# Patient Record
Sex: Male | Born: 1995
Health system: Southern US, Community
[De-identification: ages and names within clinical notes are randomized; demographics above are authoritative.]

## PROBLEM LIST (undated history)

## (undated) DIAGNOSIS — E300 Delayed puberty: Secondary | ICD-10-CM

## (undated) HISTORY — DX: Delayed puberty: E30.0

---
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
## 2016-03-30 ENCOUNTER — Ambulatory Visit: Payer: BLUE CROSS/BLUE SHIELD

## 2016-04-19 ENCOUNTER — Ambulatory Visit (INDEPENDENT_AMBULATORY_CARE_PROVIDER_SITE_OTHER): Payer: BLUE CROSS/BLUE SHIELD | Admitting: Physician Assistant

## 2016-04-19 VITALS — BP 120/74 | HR 93 | Temp 98.2°F | Resp 17 | Ht 70.0 in | Wt 151.0 lb

## 2016-04-19 DIAGNOSIS — J020 Streptococcal pharyngitis: Secondary | ICD-10-CM | POA: Diagnosis not present

## 2016-04-19 DIAGNOSIS — J029 Acute pharyngitis, unspecified: Secondary | ICD-10-CM

## 2016-04-19 LAB — POCT CBC
GRANULOCYTE PERCENT: 76.3 % (ref 37–80)
HCT, POC: 37.6 % — AB (ref 43.5–53.7)
HEMOGLOBIN: 13.4 g/dL — AB (ref 14.1–18.1)
Lymph, poc: 2 (ref 0.6–3.4)
MCH: 31.3 pg — AB (ref 27–31.2)
MCHC: 35.6 g/dL — AB (ref 31.8–35.4)
MCV: 88 fL (ref 80–97)
MID (CBC): 1 — AB (ref 0–0.9)
MPV: 6.9 fL (ref 0–99.8)
PLATELET COUNT, POC: 172 10*3/uL (ref 142–424)
POC Granulocyte: 9.8 — AB (ref 2–6.9)
POC LYMPH PERCENT: 15.7 %L (ref 10–50)
POC MID %: 8 % (ref 0–12)
RBC: 4.27 M/uL — AB (ref 4.69–6.13)
RDW, POC: 13.2 %
WBC: 12.8 10*3/uL — AB (ref 4.6–10.2)

## 2016-04-19 LAB — POCT RAPID STREP A (OFFICE): Rapid Strep A Screen: POSITIVE — AB

## 2016-04-19 MED ORDER — AMOXICILLIN 500 MG PO TABS: 500.0000 mg | ORAL_TABLET | Freq: Two times a day (BID) | ORAL | 0 refills | Status: AC

## 2016-04-19 NOTE — Progress Notes (Signed)
Patrick Davidson  MRN: [id number] DOB: [birth year]  Subjective:  Patrick Davidson is a 20 y.o. male seen in office today for a chief complaint of sore throat x 3 days. Has associated difficulty swallowing, raspy voice, fatigue, and nasal congestion. Denies fever, cough, body aches, chills, and ear fullness  Of note, [facility] for sore throat and was tested for both the strep and flu.. They were both negative but he was treated empirically with 10 day course of cefnidir for strep throat. No culture was ordered. Notes his sore throat completley resolved after being on cefdiner for about 2-3 days.  Pt has history of sore throats but has never tested positive for strep. His last mono test in 07/2015 was positive for IgG.  Pt's mother, grandmother, and older have history of Zenkers diverticulum and have had surgery to repair it due to recurring throat infections. His mother has him scheduled to see her ENT on 05/03/16.   Review of Systems  Respiratory: Negative for shortness of breath.   Cardiovascular: Negative for chest pain and palpitations.  Gastrointestinal: Negative for diarrhea, nausea and vomiting.  Skin: Negative for rash.   There are no active problems to display for this patient.  No current outpatient prescriptions on file prior to visit.   No current facility-administered medications on file prior to visit.     No Known Allergies   Objective:  BP 120/74 (BP Location: Right Arm, Patient Position: Sitting, Cuff Size: Normal)   Pulse 93   Temp 98.2 F (36.8 C) (Oral)   Resp 17   Ht 5\' 10"  (1.778 m)   Wt 151 lb (68.5 kg)   SpO2 99%   BMI 21.67 kg/m   Physical Exam  Constitutional: He is oriented to person, place, and time and well-developed, well-nourished, and in no distress.  HENT:  Head: Normocephalic and atraumatic.  Mouth/Throat: Uvula is midline and mucous membranes are normal. Posterior oropharyngeal erythema present.    Tonsils are exudative and swollen bilaterally.   Eyes: Conjunctivae are normal.  Neck: Normal range of motion.  Cardiovascular: Normal rate, regular rhythm and normal heart sounds.   Pulmonary/Chest: Effort normal and breath sounds normal.  Lymphadenopathy:    He has cervical adenopathy.       Right cervical: Posterior cervical adenopathy present. No superficial cervical and no deep cervical adenopathy present.      Left cervical: Posterior cervical adenopathy present. No superficial cervical and no deep cervical adenopathy present.       Right: No supraclavicular adenopathy present.       Left: No supraclavicular adenopathy present.  Neurological: He is alert and oriented to person, place, and time. Gait normal.  Skin: Skin is warm and dry.  Psychiatric: Affect normal.   Results for orders placed or performed in visit on 04/19/16 (from the past 24 hour(s))  POCT rapid strep A     Status: Abnormal   Collection Time: 04/19/16  4:38 PM  Result Value Ref Range   Rapid Strep A Screen Positive (A) Negative  POCT CBC     Status: Abnormal   Collection Time: 04/19/16  4:45 PM  Result Value Ref Range   WBC 12.8 (A) 4.6 - 10.2 K/uL   Lymph, poc 2.0 0.6 - 3.4   POC LYMPH PERCENT 15.7 10 - 50 %L   MID (cbc) 1.0 (A) 0 - 0.9   POC MID % 8.0 0 - 12 %M   POC  Granulocyte 9.8 (A) 2 - 6.9   Granulocyte percent 76.3 37 - 80 %G   RBC 4.27 (A) 4.69 - 6.13 M/uL   Hemoglobin 13.4 (A) 14.1 - 18.1 g/dL   HCT, POC 16.137.6 (A) 09.643.5 - 53.7 %   MCV 88.0 80 - 97 fL   MCH, POC 31.3 (A) 27 - 31.2 pg   MCHC 35.6 (A) 31.8 - 35.4 g/dL   RDW, POC 04.513.2 %   Platelet Count, POC 172 142 - 424 K/uL   MPV 6.9 0 - 99.8 fL    Assessment and Plan :  1. Strep pharyngitis -Although pt was recently just treated for strep with cefdinir, since patient has never actually tested positive for strep, will treat with first line amoxicillin. However, pt instructed that if he develops a fever or worsening symptoms over the next  24-48 hours to return to clinic as he may need to be treated for recurrent strep with clindamycin at this time.   - POCT CBC - POCT rapid strep A - Epstein-Barr virus VCA antibody panel - Culture, Group A Strep - amoxicillin (AMOXIL) 500 MG tablet; Take 1 tablet (500 mg total) by mouth 2 (two) times daily.  Dispense: 20 tablet; Refill: 0   Patrick CoreBrittany Jeromey Kruer PA-C  [facility] 04/19/2016 4:52 PM

## 2016-04-19 NOTE — Patient Instructions (Addendum)
Return to clinic if you develop fever or start to feel worse. We are closed Thursday so go to the ER if symptoms are worse on that day.    Strep Throat Strep throat is an infection of the throat. It is caused by germs. Strep throat spreads from person to person because of coughing, sneezing, or close contact. Follow these instructions at home: Medicines  Take over-the-counter and prescription medicines only as told by your doctor.  Take your antibiotic medicine as told by your doctor. Do not stop taking the medicine even if you feel better.  Have family members who also have a sore throat or fever go to a doctor. Eating and drinking  Do not share food, drinking cups, or personal items.  Try eating soft foods until your sore throat feels better.  Drink enough fluid to keep your pee (urine) clear or pale yellow. General instructions  Rinse your mouth (gargle) with a salt-water mixture 3-4 times per day or as needed. To make a salt-water mixture, stir -1 tsp of salt into 1 cup of warm water.  Make sure that all people in your house wash their hands well.  Rest.  Stay home from school or work until you have been taking antibiotics for 24 hours.  Keep all follow-up visits as told by your doctor. This is important. Contact a doctor if:  Your neck keeps getting bigger.  You get a rash, cough, or earache.  You cough up thick liquid that is green, yellow-brown, or bloody.  You have pain that does not get better with medicine.  Your problems get worse instead of getting better.  You have a fever. Get help right away if:  You throw up (vomit).  You get a very bad headache.  You neck hurts or it feels stiff.  You have chest pain or you are short of breath.  You have drooling, very bad throat pain, or changes in your voice.  Your neck is swollen or the skin gets red and tender.  Your mouth is dry or you are peeing less than normal.  You keep feeling more tired or it is  hard to wake up.  Your joints are red or they hurt. This information is not intended to replace advice given to you by your health care provider. Make sure you discuss any questions you have with your health care provider. Document Released: 11/02/2007 Document Revised: 01/13/2016 Document Reviewed: 09/08/2014 Elsevier Interactive Patient Education  2017 [employer]     IF you received an x-ray today, you will receive an invoice from [facility]. Please contact [facility] at [phone] with questions or concerns regarding your invoice.   IF you received labwork today, you will receive an invoice from [facility]. Please contact [facility] at [phone] with questions or concerns regarding your invoice.   Our billing staff will not be able to assist you with questions regarding bills from these companies.  You will be contacted with the lab results as soon as they are available. The fastest way to get your results is to activate your My Chart account. Instructions are located on the last page of this paperwork. If you have not heard from us regarding the results in 2 weeks, please contact this office.

## 2016-04-20 LAB — EPSTEIN-BARR VIRUS VCA ANTIBODY PANEL
EBV NA IgG: 127 U/mL — ABNORMAL HIGH
EBV VCA IgG: 98.1 U/mL — ABNORMAL HIGH
EBV VCA IgM: <36 U/mL

## 2016-05-05 DIAGNOSIS — J0391 Acute recurrent tonsillitis, unspecified: Secondary | ICD-10-CM | POA: Diagnosis not present

## 2016-06-27 ENCOUNTER — Ambulatory Visit (INDEPENDENT_AMBULATORY_CARE_PROVIDER_SITE_OTHER): Payer: BLUE CROSS/BLUE SHIELD | Admitting: Emergency Medicine

## 2016-06-27 VITALS — BP 120/80 | HR 81 | Temp 98.1°F | Resp 18 | Ht 70.02 in | Wt 156.4 lb

## 2016-06-27 DIAGNOSIS — R05 Cough: Secondary | ICD-10-CM | POA: Diagnosis not present

## 2016-06-27 DIAGNOSIS — J069 Acute upper respiratory infection, unspecified: Secondary | ICD-10-CM

## 2016-06-27 DIAGNOSIS — R059 Cough, unspecified: Secondary | ICD-10-CM

## 2016-06-27 MED ORDER — DM-GUAIFENESIN ER 30-600 MG PO TB12: 1.0000 | ORAL_TABLET | Freq: Two times a day (BID) | ORAL | 0 refills | Status: AC

## 2016-06-27 MED ORDER — BENZONATATE 200 MG PO CAPS: 200.0000 mg | ORAL_CAPSULE | Freq: Two times a day (BID) | ORAL | 0 refills | Status: DC | PRN

## 2016-06-27 NOTE — Progress Notes (Signed)
Patrick Davidson 21 y.o.   Chief Complaint  Patient presents with  . Sore Throat  . Fatigue  . Cough    HISTORY OF PRESENT ILLNESS: This is a 21 y.o. male complaining of sore throat, fatigue, and cough since last week. Feeling better today but cough still lingering.  Sore Throat   This is a new problem. The current episode started in the past 7 days. The problem has been rapidly improving. There has been no fever. Associated symptoms include congestion, coughing and headaches. Pertinent negatives include no abdominal pain, diarrhea, ear pain, neck pain, shortness of breath or vomiting. 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  Cough  This is a new problem. The current episode started in the past 7 days. The problem has been gradually improving. The problem occurs hourly. The cough is non-productive. Associated symptoms include chills, headaches and nasal congestion. Pertinent negatives include no chest pain, ear congestion, ear pain, eye redness, fever, hemoptysis, myalgias, rash, sore throat, shortness of breath or wheezing. Risk factors: none. chronic throat infections     Prior to Admission medications   Medication Sig Start Date End Date Taking? Authorizing Provider  benzonatate (TESSALON) 200 MG capsule Take 1 capsule (200 mg total) by mouth 2 (two) times daily as needed for cough. 06/27/16   Georgina QuintMiguel Jose Devona Holmes, MD  dextromethorphan-guaiFENesin [facility](MUCINEX DM) 30-600 MG 12hr tablet Take 1 tablet by mouth 2 (two) times daily. 06/27/16 07/02/16  Georgina QuintMiguel Jose Trasean Delima, MD    No Known Allergies  There are no active problems to display for this patient.   Past Medical History:  Diagnosis Date  . Pubertal delay     History reviewed. No pertinent surgical history.  Social History   Social History  . Marital status: Single    Spouse name: N/A  . Number of children: N/A  . Years of education: N/A   Occupational History  . Not on file.   Social History Main Topics  . Smoking status:  Never Smoker  . Smokeless tobacco: Never Used  . Alcohol use No  . Drug use: No  . Sexual activity: No   Other Topics Concern  . Not on file   Social History Narrative   Lives with mom and dad and brother. 8th grade. Doing well in school. Soccer travel team    Family History  Problem Relation Age of Onset  . Cancer Paternal Grandfather     lung  . Delayed puberty Mother   . Hypertension Mother   . Delayed puberty Father   . Early puberty Brother   . Cancer Maternal Grandmother     skin  . Hypertension Maternal Grandfather   . Heart attack Maternal Grandfather   . Thyroid disease Paternal Grandmother     graves dx age 20     Review of Systems  Constitutional: Positive for chills. Negative for fever.  HENT: Positive for congestion. Negative for ear pain, nosebleeds, sinus pain and sore throat.   Eyes: Negative for discharge and redness.  Respiratory: Positive for cough. Negative for hemoptysis, sputum production, shortness of breath and wheezing.   Cardiovascular: Negative for chest pain and palpitations.  Gastrointestinal: Negative for abdominal pain, diarrhea, nausea and vomiting.  Genitourinary: Negative for dysuria and hematuria.  Musculoskeletal: Negative for myalgias and neck pain.  Skin: Negative for rash.  Neurological: Positive for headaches. Negative for dizziness and sensory change.  Endo/Heme/Allergies: Negative.   Psychiatric/Behavioral: Negative.   All other systems reviewed and are negative.   Vitals:  06/27/16 1106  BP: 120/80  Pulse: 81  Resp: 18  Temp: 98.1 F (36.7 C)    Physical Exam  Constitutional: He is oriented to person, place, and time. He appears well-developed and well-nourished.  HENT:  Head: Normocephalic and atraumatic.  Right Ear: External ear normal.  Left Ear: External ear normal.  Nose: Nose normal.  Mild erythema oropharynx, no exudates  Eyes: Conjunctivae and EOM are normal. Pupils are equal, round, and reactive to  light.  Neck: Normal range of motion. Neck supple.  Cardiovascular: Normal rate, regular rhythm and normal heart sounds.   Pulmonary/Chest: Effort normal and breath sounds normal. He has no wheezes. He has no rales.  Abdominal: Soft. He exhibits no distension. There is no tenderness.  Musculoskeletal: Normal range of motion. He exhibits no edema or tenderness.  Neurological: He is alert and oriented to person, place, and time. No sensory deficit. He exhibits normal muscle tone.  Skin: Skin is warm and dry. Capillary refill takes less than 2 seconds.  Psychiatric: He has a normal mood and affect. His behavior is normal.  Vitals reviewed.    ASSESSMENT & PLAN: Patrick Davidson was seen today for sore throat, fatigue and cough.  Diagnoses and all orders for this visit:  Acute upper respiratory infection  Cough  Other orders -     benzonatate (TESSALON) 200 MG capsule; Take 1 capsule (200 mg total) by mouth 2 (two) times daily as needed for cough. -     dextromethorphan-guaiFENesin (MUCINEX DM) 30-600 MG 12hr tablet; Take 1 tablet by mouth 2 (two) times daily.    Patient Instructions       IF you received an x-ray today, you will receive an invoice from [facility]. Please contact [facility] at [phone] with questions or concerns regarding your invoice.   IF you received labwork today, you will receive an invoice from [facility]. Please contact [facility] at [phone] with questions or concerns regarding your invoice.   Our billing staff will not be able to assist you with questions regarding bills from these companies.  You will be contacted with the lab results as soon as they are available. The fastest way to get your results is to activate your My Chart account. Instructions are located on the last page of this paperwork. If you have not heard from [place] regarding the results in 2 weeks, please contact this office.     Upper Respiratory Infection, Adult Most  upper respiratory infections (URIs) are caused by a virus. A URI affects the nose, throat, and upper air passages. The most common type of URI is often called "the common cold." Follow these instructions at home:  Take medicines only as told by your doctor.  Gargle warm saltwater or take cough drops to comfort your throat as told by your doctor.  Use a warm mist humidifier or inhale steam from a shower to increase air moisture. This may make it easier to breathe.  Drink enough fluid to keep your pee (urine) clear or pale yellow.  Eat soups and other clear broths.  Have a healthy diet.  Rest as needed.  Go back to work when your fever is gone or your doctor says it is okay.  You may need to stay home longer to avoid giving your URI to others.  You can also wear a face mask and wash your hands often to prevent spread of the virus.  Use your inhaler more if you have asthma.  Do not use any tobacco  products, including cigarettes, chewing tobacco, or electronic cigarettes. If you need help quitting, ask your doctor. Contact a doctor if:  You are getting worse, not better.  Your symptoms are not helped by medicine.  You have chills.  You are getting more short of breath.  You have brown or red mucus.  You have yellow or brown discharge from your nose.  You have pain in your face, especially when you bend forward.  You have a fever.  You have puffy (swollen) neck glands.  You have pain while swallowing.  You have white areas in the back of your throat. Get help right away if:  You have very bad or constant:  Headache.  Ear pain.  Pain in your forehead, behind your eyes, and over your cheekbones (sinus pain).  Chest pain.  You have long-lasting (chronic) lung disease and any of the following:  Wheezing.  Long-lasting cough.  Coughing up blood.  A change in your usual mucus.  You have a stiff neck.  You have changes in  your:  Vision.  Hearing.  Thinking.  Mood. This information is not intended to replace advice given to you by your health care provider. Make sure you discuss any questions you have with your health care provider. Document Released: 11/02/2007 Document Revised: 01/17/2016 Document Reviewed: 08/21/2013 Elsevier Interactive Patient Education  2017 Elsevier Inc.      Edwina Barth, MD [facility]

## 2016-06-27 NOTE — Patient Instructions (Addendum)
     IF you received an x-ray today, you will receive an invoice from [facility]. Please contact [facility] at [phone] with questions or concerns regarding your invoice.   IF you received labwork today, you will receive an invoice from [facility]. Please contact [facility] at [phone] with questions or concerns regarding your invoice.   Our billing staff will not be able to assist you with questions regarding bills from these companies.  You will be contacted with the lab results as soon as they are available. The fastest way to get your results is to activate your My Chart account. Instructions are located on the last page of this paperwork. If you have not heard from us regarding the results in 2 weeks, please contact this office.      Upper Respiratory Infection, Adult Most upper respiratory infections (URIs) are caused by a virus. A URI affects the nose, throat, and upper air passages. The most common type of URI is often called "the common cold." Follow these instructions at home:  Take medicines only as told by your doctor.  Gargle warm saltwater or take cough drops to comfort your throat as told by your doctor.  Use a warm mist humidifier or inhale steam from a shower to increase air moisture. This may make it easier to breathe.  Drink enough fluid to keep your pee (urine) clear or pale yellow.  Eat soups and other clear broths.  Have a healthy diet.  Rest as needed.  Go back to work when your fever is gone or your doctor says it is okay.  You may need to stay home longer to avoid giving your URI to others.  You can also wear a face mask and wash your hands often to prevent spread of the virus.  Use your inhaler more if you have asthma.  Do not use any tobacco products, including cigarettes, chewing tobacco, or electronic cigarettes. If you need help quitting, ask your doctor. Contact a doctor if:  You are getting worse, not better.  Your  symptoms are not helped by medicine.  You have chills.  You are getting more short of breath.  You have brown or red mucus.  You have yellow or brown discharge from your nose.  You have pain in your face, especially when you bend forward.  You have a fever.  You have puffy (swollen) neck glands.  You have pain while swallowing.  You have white areas in the back of your throat. Get help right away if:  You have very bad or constant:  Headache.  Ear pain.  Pain in your forehead, behind your eyes, and over your cheekbones (sinus pain).  Chest pain.  You have long-lasting (chronic) lung disease and any of the following:  Wheezing.  Long-lasting cough.  Coughing up blood.  A change in your usual mucus.  You have a stiff neck.  You have changes in your:  Vision.  Hearing.  Thinking.  Mood. This information is not intended to replace advice given to you by your health care provider. Make sure you discuss any questions you have with your health care provider. Document Released: 11/02/2007 Document Revised: 01/17/2016 Document Reviewed: 08/21/2013 Elsevier Interactive Patient Education  2017 Elsevier Inc.  

## 2016-07-05 DIAGNOSIS — D225 Melanocytic nevi of trunk: Secondary | ICD-10-CM | POA: Diagnosis not present

## 2016-07-05 DIAGNOSIS — D229 Melanocytic nevi, unspecified: Secondary | ICD-10-CM | POA: Diagnosis not present

## 2016-07-05 DIAGNOSIS — D485 Neoplasm of uncertain behavior of skin: Secondary | ICD-10-CM | POA: Diagnosis not present

## 2016-12-30 ENCOUNTER — Encounter (HOSPITAL_COMMUNITY): Payer: Self-pay | Admitting: Emergency Medicine

## 2016-12-30 DIAGNOSIS — Z202 Contact with and (suspected) exposure to infections with a predominantly sexual mode of transmission: Secondary | ICD-10-CM | POA: Diagnosis not present

## 2016-12-30 NOTE — ED Triage Notes (Signed)
Pt states he wants to be checked for STDs  Pt denies any sxs

## 2016-12-31 ENCOUNTER — Emergency Department (HOSPITAL_COMMUNITY)
Admission: EM | Admit: 2016-12-31 | Discharge: 2016-12-31 | Disposition: A | Discharge status: Home / Self Care | Payer: BLUE CROSS/BLUE SHIELD | Attending: Emergency Medicine | Admitting: Emergency Medicine

## 2016-12-31 DIAGNOSIS — Z202 Contact with and (suspected) exposure to infections with a predominantly sexual mode of transmission: Secondary | ICD-10-CM

## 2016-12-31 LAB — RPR: RPR: NONREACTIVE

## 2016-12-31 LAB — HIV ANTIBODY (ROUTINE TESTING W REFLEX): HIV SCREEN 4TH GENERATION: NONREACTIVE

## 2016-12-31 NOTE — Discharge Instructions (Signed)
Please read attached information. If you experience any new or worsening signs or symptoms please return to the emergency room for evaluation. [facility] testing or treatment.

## 2016-12-31 NOTE — ED Provider Notes (Signed)
[facility]-EMERGENCY DEPT Provider Note   CSN: [id number] Arrival date & time: 12/30/16  2343     History   Chief Complaint Chief Complaint  Patient presents with  . STD check    HPI Patrick Davidson is a 21 y.o. male.  HPI    21 year-old male presents today for STD check. Patient reports that He was contacted by one of his friends reported that a neutral sexual partner had an STD. Patient denies any penile discharge, rash, pain, dysuria, or any other significant signs or symptoms. Patient denies any history of STD in the past.    Past Medical History:  Diagnosis Date  . Pubertal delay     Patient Active Problem List   Diagnosis Date Noted  . Acute upper respiratory infection 06/27/2016    History reviewed. No pertinent surgical history.     Home Medications    Prior to Admission medications   Medication Sig Start Date End Date Taking? Authorizing Provider  benzonatate (TESSALON) 200 MG capsule Take 1 capsule (200 mg total) by mouth 2 (two) times daily as needed for cough. 06/27/16   Georgina QuintSagardia, Miguel Jose, MD    Family History Family History  Problem Relation Age of Onset  . Cancer Paternal Grandfather        lung  . Delayed puberty Mother   . Hypertension Mother   . Delayed puberty Father   . Early puberty Brother   . Cancer Maternal Grandmother        skin  . Hypertension Maternal Grandfather   . Heart attack Maternal Grandfather   . Thyroid disease Paternal Grandmother        graves dx age 270    Social History Social History  Substance Use Topics  . Smoking status: Never Smoker  . Smokeless tobacco: Never Used  . Alcohol use Yes     Comment: social     Allergies   Patient has no known allergies.   Review of Systems Review of Systems  All other systems reviewed and are negative.    Physical Exam Updated Vital Signs BP (!) 145/72 (BP Location: Left Arm)   Pulse (!) 56   Temp 98.4 F (36.9 C) (Oral)   Resp 16   Ht 5\' 10"  (1.778 m)    Wt 70.3 kg (155 lb)   SpO2 100%   BMI 22.24 kg/m   Physical Exam  Constitutional: He is oriented to person, place, and time. He appears well-developed and well-nourished.  HENT:  Head: Normocephalic and atraumatic.  Eyes: Pupils are equal, round, and reactive to light. Conjunctivae are normal. Right eye exhibits no discharge. Left eye exhibits no discharge. No scleral icterus.  Neck: Normal range of motion. No JVD present. No tracheal deviation present.  Pulmonary/Chest: Effort normal. No stridor.  Genitourinary:  Genitourinary Comments: Circumcised no rashes  Neurological: He is alert and oriented to person, place, and time. Coordination normal.  Psychiatric: He has a normal mood and affect. His behavior is normal. Judgment and thought content normal.  Nursing note and vitals reviewed.    ED Treatments / Results  Labs (all labs ordered are listed, but only abnormal results are displayed) Labs Reviewed  RPR  HIV ANTIBODY (ROUTINE TESTING)  GC/CHLAMYDIA PROBE AMP ([place]) NOT AT [facility]    EKG  EKG Interpretation None       Radiology No results found.  Procedures Procedures (including critical care time)  Medications Ordered in ED Medications - No data to display  Initial Impression / Assessment and Plan / ED Course  I have reviewed the triage vital signs and the nursing notes.  Pertinent labs & imaging results that were available during my care of the patient were reviewed by me and considered in my medical decision making (see chart for details).      Final Clinical Impressions(s) / ED Diagnoses   Final diagnoses:  Possible exposure to STD    Labs: RPR, HIV  Imaging:  Consults:  Therapeutics:  Discharge Meds:    Assessment/Plan:    New Prescriptions New Prescriptions   No medications on file     Eyvonne MechanicHedges, Arnitra Sokoloski, PA-C 12/31/16 0108    Ward, Layla MawKristen N, DO 12/31/16 [id number]

## 2017-01-02 LAB — GC/CHLAMYDIA PROBE AMP (~~LOC~~) NOT AT ARMC
CHLAMYDIA, DNA PROBE: NEGATIVE
Neisseria Gonorrhea: NEGATIVE

## 2017-06-19 DIAGNOSIS — Z202 Contact with and (suspected) exposure to infections with a predominantly sexual mode of transmission: Secondary | ICD-10-CM | POA: Diagnosis not present

## 2018-05-28 DIAGNOSIS — D2239 Melanocytic nevi of other parts of face: Secondary | ICD-10-CM | POA: Diagnosis not present

## 2018-05-28 DIAGNOSIS — D2262 Melanocytic nevi of left upper limb, including shoulder: Secondary | ICD-10-CM | POA: Diagnosis not present

## 2018-05-28 DIAGNOSIS — D225 Melanocytic nevi of trunk: Secondary | ICD-10-CM | POA: Diagnosis not present

## 2018-06-15 DIAGNOSIS — D2262 Melanocytic nevi of left upper limb, including shoulder: Secondary | ICD-10-CM | POA: Diagnosis not present

## 2018-07-03 ENCOUNTER — Ambulatory Visit: Payer: BLUE CROSS/BLUE SHIELD | Admitting: Physician Assistant

## 2018-07-26 DIAGNOSIS — S62514A Nondisplaced fracture of proximal phalanx of right thumb, initial encounter for closed fracture: Secondary | ICD-10-CM | POA: Diagnosis not present

## 2018-07-26 DIAGNOSIS — M79641 Pain in right hand: Secondary | ICD-10-CM | POA: Diagnosis not present

## 2018-07-26 DIAGNOSIS — M79644 Pain in right finger(s): Secondary | ICD-10-CM | POA: Diagnosis not present

## 2018-08-21 DIAGNOSIS — D485 Neoplasm of uncertain behavior of skin: Secondary | ICD-10-CM | POA: Diagnosis not present

## 2018-08-21 DIAGNOSIS — L905 Scar conditions and fibrosis of skin: Secondary | ICD-10-CM | POA: Diagnosis not present

## 2018-08-21 DIAGNOSIS — D2239 Melanocytic nevi of other parts of face: Secondary | ICD-10-CM | POA: Diagnosis not present

## 2018-11-23 DIAGNOSIS — L905 Scar conditions and fibrosis of skin: Secondary | ICD-10-CM | POA: Diagnosis not present

## 2019-03-01 ENCOUNTER — Other Ambulatory Visit: Payer: Self-pay

## 2019-03-01 ENCOUNTER — Ambulatory Visit
Admission: EM | Admit: 2019-03-01 | Discharge: 2019-03-01 | Disposition: A | Discharge status: Home / Self Care | Payer: BC Managed Care – PPO | Attending: Physician Assistant | Admitting: Physician Assistant

## 2019-03-01 DIAGNOSIS — R0981 Nasal congestion: Secondary | ICD-10-CM

## 2019-03-01 DIAGNOSIS — R0789 Other chest pain: Secondary | ICD-10-CM

## 2019-03-01 NOTE — ED Triage Notes (Signed)
Pt presents to UC stating he has had tightness in his chest starting 4 days ago. Pt's mother states 3 weeks ago he had severe nasal congestion.

## 2019-03-01 NOTE — ED Provider Notes (Signed)
[facility]    CSN: [id number] Arrival date & time: 03/01/19  1114      History   Chief Complaint No chief complaint on file.   HPI Patrick Davidson is a 23 y.o. male.   23 year old male comes in with mother for 4 day history of chest tightness. States this occurs while he is running. He is on the track team, and has been training for a marathon. States feels like he needs to take deeper breaths. He is still able to finish full course without difficulty and without taking more breaks. States 3 weeks ago, had severe nasal congestion, which is normal for patient. At this time, nasal congestion has resolved. Denies other URI symptoms such as cough, rhinorrhea, sore throat. Denies fever, chills, body aches. Denies abdominal pain, nausea, vomiting, diarrhea. Denies shortness of breath, loss of taste/smell. Denies chest pain, orthopnea, leg swelling. Denies current symptoms. Has had teammates tested for COVID, unknown results.  Mother worries of heart problems. Patient denies dizziness/lightheadedness/syncope during exertion. Denies exertional chest pain. Denies personal history of heart disease. Mother states she started HTN medication at age 27, but denies heart disease. Grandfather with MI at age 42.      Past Medical History:  Diagnosis Date  . Pubertal delay     Patient Active Problem List   Diagnosis Date Noted  . Acute upper respiratory infection 06/27/2016    History reviewed. No pertinent surgical history.     Home Medications    Prior to Admission medications   Not on File    Family History Family History  Problem Relation Age of Onset  . Cancer Paternal Grandfather        lung  . Delayed puberty Mother   . Hypertension Mother   . Delayed puberty Father   . Early puberty Brother   . Cancer Maternal Grandmother        skin  . Hypertension Maternal Grandfather   . Heart attack Maternal Grandfather   . Thyroid disease Paternal Grandmother      graves dx age 69    Social History Social History   Tobacco Use  . Smoking status: Never Smoker  . Smokeless tobacco: Never Used  Substance Use Topics  . Alcohol use: Yes    Comment: weekly  . Drug use: No     Allergies   Patient has no known allergies.   Review of Systems Review of Systems  Reason unable to perform ROS: See HPI as above.     Physical Exam Triage Vital Signs ED Triage Vitals  Enc Vitals Group     BP 03/01/19 1124 128/75     Pulse Rate 03/01/19 1124 71     Resp 03/01/19 1124 16     Temp 03/01/19 1124 97.8 F (36.6 C)     Temp Source 03/01/19 1124 Oral     SpO2 03/01/19 1124 98 %     Weight --      Height --      Head Circumference --      Peak Flow --      Pain Score 03/01/19 1128 0     Pain Loc --      Pain Patrick? --      Excl. in GC? --    No data found.  Updated Vital Signs BP 128/75 (BP Location: Left Arm)   Pulse 71   Temp 97.8 F (36.6 C) (Oral)   Resp 16   SpO2 98%  Physical Exam Constitutional:      General: He is not in acute distress.    Appearance: Normal appearance. He is not ill-appearing, toxic-appearing or diaphoretic.  HENT:     Head: Normocephalic and atraumatic.     Mouth/Throat:     Mouth: Mucous membranes are moist.     Pharynx: Oropharynx is clear. Uvula midline.  Neck:     Musculoskeletal: Normal range of motion and neck supple.  Cardiovascular:     Rate and Rhythm: Normal rate and regular rhythm.     Heart sounds: Normal heart sounds. No murmur. No friction rub. No gallop.   Pulmonary:     Effort: Pulmonary effort is normal. No accessory muscle usage, prolonged expiration, respiratory distress or retractions.     Comments: Lungs clear to auscultation without adventitious lung sounds. Skin:    General: Skin is warm and dry.  Neurological:     General: No focal deficit present.     Mental Status: He is alert and oriented to person, place, and time.      UC Treatments / Results  Labs (all labs  ordered are listed, but only abnormal results are displayed) Labs Reviewed - No data to display  EKG   Radiology No results found.  Procedures Procedures (including critical care time)  Medications Ordered in UC Medications - No data to display  Initial Impression / Assessment and Plan / UC Course  I have reviewed the triage vital signs and the nursing notes.  Pertinent labs & imaging results that were available during my care of the patient were reviewed by me and considered in my medical decision making (see chart for details).    Patient currently asymptomatic.  EKG sinus bradycardia, 55 bpm, rightward axis, early repolarization, no acute ST changes.  No prior EKGs for comparison.  Discussed COVID testing, patient would like to be tested at school due to a faster turnaround.  Otherwise, monitor symptoms, patient to follow-up with PCP for further evaluation and management if continues with symptoms.  Return precautions given.  Patient expresses understanding and agrees to plan.  Final Clinical Impressions(s) / UC Diagnoses   Final diagnoses:  Nasal congestion  Chest tightness    ED Prescriptions    None     PDMP not reviewed this encounter.   Ok Edwards, PA-C 03/01/19 1418

## 2019-03-01 NOTE — Discharge Instructions (Addendum)
EKG with slow heart rate-- this is normally due to good fitness at your age-- no alarming rhythm. No alarming signs on exam. As discussed, will have you do COVID testing at school for faster results. Remain quarantined until testing results return. Can use flonase/nasacort over the counter for nasal congestion. Continue to monitor your symptoms. If continues with chest tightness while running, please follow up with PCP for further evaluation and management needed.

## 2019-04-22 DIAGNOSIS — Z1159 Encounter for screening for other viral diseases: Secondary | ICD-10-CM | POA: Diagnosis not present

## 2019-07-31 ENCOUNTER — Encounter: Payer: Self-pay | Admitting: Emergency Medicine

## 2019-07-31 ENCOUNTER — Ambulatory Visit
Admission: EM | Admit: 2019-07-31 | Discharge: 2019-07-31 | Disposition: A | Discharge status: Home / Self Care | Payer: PRIVATE HEALTH INSURANCE

## 2019-07-31 DIAGNOSIS — K591 Functional diarrhea: Secondary | ICD-10-CM

## 2019-07-31 NOTE — ED Provider Notes (Signed)
[facility]    CSN: [id number] Arrival date & time: 07/31/19  1527      History   Chief Complaint Chief Complaint  Patient presents with  . Fecal Impaction    HPI Esker D Cottingham is a 24 y.o. male with history of bradycardia presenting for evaluation of loose stools.  States this has been ongoing since January.  Patient denying watery, fatty, bloody stools, increased frequency of bowel movements, rectal or anal pain or spasming.  Denies anal leakage, change in urinary symptoms, abdominal pain, nausea, vomiting.  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  Denies fever, chills, arthralgias or myalgias.  States his mother does have history of IBS, though patient does not.  Patient states he used to eat food on campus at a different location, though this past semester (since January) he has been eating at a different site.    Past Medical History:  Diagnosis Date  . Pubertal delay     Patient Active Problem List   Diagnosis Date Noted  . Acute upper respiratory infection 06/27/2016    History reviewed. No pertinent surgical history.     Home Medications    Prior to Admission medications   Not on File    Family History Family History  Problem Relation Age of Onset  . Cancer Paternal Grandfather        lung  . Delayed puberty Mother   . Hypertension Mother   . Delayed puberty Father   . Early puberty Brother   . Cancer Maternal Grandmother        skin  . Hypertension Maternal Grandfather   . Heart attack Maternal Grandfather   . Thyroid disease Paternal Grandmother        graves dx age 46    Social History Social History   Tobacco Use  . Smoking status: Never Smoker  . Smokeless tobacco: Never Used  Substance Use Topics  . Alcohol use: Yes    Comment: weekly  . Drug use: No     Allergies   Patient has no known  allergies.   Review of Systems As per HPI   Physical Exam Triage Vital Signs ED Triage Vitals  Enc Vitals Group     BP      Pulse      Resp      Temp      Temp src      SpO2      Weight      Height      Head Circumference      Peak Flow      Pain Score      Pain Loc      Pain Edu?      Excl. in GC?    No data found.  Updated Vital Signs BP 123/62 (BP Location: Left Arm)   Pulse (!) 52   Temp 98 F (36.7 C) (Oral)   Resp 16   SpO2 98%   Visual Acuity Right Eye Distance:   Left Eye Distance:   Bilateral Distance:    Right Eye Near:   Left Eye Near:    Bilateral Near:     Physical Exam Constitutional:      General: He is not in acute distress.    Appearance: He is normal weight. He is not ill-appearing or diaphoretic.  HENT:     Head: Normocephalic and  atraumatic.     Mouth/Throat:     Mouth: Mucous membranes are moist.     Pharynx: Oropharynx is clear.     Comments: No oral lesions or ulcerations Eyes:     General: No scleral icterus.    Conjunctiva/sclera: Conjunctivae normal.     Pupils: Pupils are equal, round, and reactive to light.  Cardiovascular:     Rate and Rhythm: Regular rhythm. Bradycardia present.     Heart sounds: No murmur. No gallop.   Pulmonary:     Effort: Pulmonary effort is normal. No respiratory distress.     Breath sounds: No wheezing or rales.  Abdominal:     General: Abdomen is flat. Bowel sounds are normal. There is no distension.     Palpations: Abdomen is soft. There is no mass.     Tenderness: There is no abdominal tenderness. There is no right CVA tenderness, left CVA tenderness, guarding or rebound.     Hernia: No hernia is present.     Comments: Negative Murphy's, McBurney's, Rovsing signs.  No hepatosplenomegaly.  Musculoskeletal:        General: Normal range of motion.     Cervical back: Neck supple. No tenderness.     Right lower leg: No edema.     Left lower leg: No edema.  Lymphadenopathy:     Cervical: No  cervical adenopathy.  Skin:    Capillary Refill: Capillary refill takes less than 2 seconds.     Coloration: Skin is not jaundiced or pale.     Findings: No rash.  Neurological:     General: No focal deficit present.     Mental Status: He is alert and oriented to person, place, and time.      UC Treatments / Results  Labs (all labs ordered are listed, but only abnormal results are displayed) Labs Reviewed - No data to display  EKG   Radiology No results found.  Procedures Procedures (including critical care time)  Medications Ordered in UC Medications - No data to display  Initial Impression / Assessment and Plan / UC Course  I have reviewed the triage vital signs and the nursing notes.  Pertinent labs & imaging results that were available during my care of the patient were reviewed by me and considered in my medical decision making (see chart for details).     Exam significant for bradycardia which is chronic/stable per patient.  Denies chest pain, palpitations, shortness of breath or lightheadedness.  Very physically active: Denies symptoms during activity.  Patient having looser stools without increased bowel frequency or output, clay coloring, blood or melena.  Exam reassuring.  Likely related to diet change this semester.  Provided information on diet for diarrhea/loose stools.  Patient to keep log of symptoms, follow-up with primary care for persistent or worsening symptoms.  Return precautions discussed, patient verbalized understanding and is agreeable to plan. Final Clinical Impressions(s) / UC Diagnoses   Final diagnoses:  Functional diarrhea     Discharge Instructions     Please read food choice packet. Monitor your stools: appearance, frequency. Follow up with PCP (call/go over in person to make appointment). Return for worsening diarrhea, nausea, blood in stool, fever, abdominal pain.    ED Prescriptions    None     PDMP not reviewed this  encounter.   Hall-Potvin, [place], [place] 08/01/19 1511

## 2019-07-31 NOTE — ED Triage Notes (Signed)
Pt c/o constipation for the past month, worse in the past 2wk. States gets stomach pain feeling like he has to go but can't while playing soccer. States last BM this am that wasn't normal. States took tums for a few day with relief then stopped.

## 2019-07-31 NOTE — Discharge Instructions (Signed)
Please read food choice packet. Monitor your stools: appearance, frequency. Follow up with PCP (call/go over in person to make appointment). Return for worsening diarrhea, nausea, blood in stool, fever, abdominal pain.

## 2020-01-02 ENCOUNTER — Ambulatory Visit
Admission: EM | Admit: 2020-01-02 | Discharge: 2020-01-02 | Disposition: A | Discharge status: Home / Self Care | Payer: PRIVATE HEALTH INSURANCE | Attending: Emergency Medicine | Admitting: Emergency Medicine

## 2020-01-02 ENCOUNTER — Other Ambulatory Visit: Payer: Self-pay

## 2020-01-02 DIAGNOSIS — J020 Streptococcal pharyngitis: Secondary | ICD-10-CM | POA: Diagnosis not present

## 2020-01-02 LAB — POCT RAPID STREP A (OFFICE): Rapid Strep A Screen: POSITIVE — AB

## 2020-01-02 MED ORDER — PENICILLIN G BENZATHINE 1200000 UNIT/2ML IM SUSP
1.2000 10*6.[IU] | Freq: Once | INTRAMUSCULAR | Status: AC
  Administered 2020-01-02: 1.2 10*6.[IU] via INTRAMUSCULAR

## 2020-01-02 NOTE — ED Triage Notes (Signed)
C/o sore throat x5 days. States when this normally happen he takes dayquil/nyquil and tylenol and it usually resolves. Has been taking OTC meds and reports sore throat is getting worse.

## 2020-01-02 NOTE — ED Provider Notes (Signed)
Patrick Davidson    CSN: [id number] Arrival date & time: 01/02/20  1059      History   Chief Complaint Chief Complaint  Patient presents with  . Sore Throat    HPI Patrick Davidson is a 24 y.o. male.   Patient presents with 5-day history of sore throat.  He reports feeling cold and hot but did not take his temperature at home.  He reports having recurrent sore throat this summer but this is the first time he has sought treatment.  He has been taking DayQuil and NyQuil without relief.  Also has tried Tylenol and ibuprofen.  He denies rash, cough, shortness of breath, abdominal pain, vomiting, diarrhea, or other symptoms.  He declines COVID test.  The history is provided by the patient.    Past Medical History:  Diagnosis Date  . Pubertal delay     Patient Active Problem List   Diagnosis Date Noted  . Acute upper respiratory infection 06/27/2016    History reviewed. No pertinent surgical history.     Home Medications    Prior to Admission medications   Not on File    Family History Family History  Problem Relation Age of Onset  . Cancer Paternal Grandfather        lung  . Delayed puberty Mother   . Hypertension Mother   . Delayed puberty Father   . Early puberty Brother   . Cancer Maternal Grandmother        skin  . Hypertension Maternal Grandfather   . Heart attack Maternal Grandfather   . Thyroid disease Paternal Grandmother        graves dx age 68    Social History Social History   Tobacco Use  . Smoking status: Never Smoker  . Smokeless tobacco: Never Used  Substance Use Topics  . Alcohol use: Yes    Comment: weekly  . Drug use: No     Allergies   Patient has no known allergies.   Review of Systems Review of Systems  Constitutional: Negative for chills and fever.  HENT: Positive for sore throat. Negative for ear pain.   Eyes: Negative for pain and visual disturbance.  Respiratory: Negative for cough and shortness of breath.     Cardiovascular: Negative for chest pain and palpitations.  Gastrointestinal: Negative for abdominal pain, diarrhea, nausea and vomiting.  Genitourinary: Negative for dysuria and hematuria.  Musculoskeletal: Negative for arthralgias and back pain.  Skin: Negative for color change and rash.  Neurological: Negative for seizures and syncope.  All other systems reviewed and are negative.    Physical Exam Triage Vital Signs ED Triage Vitals [01/02/20 1109]  Enc Vitals Group     BP      Pulse      Resp      Temp      Temp src      SpO2      Weight      Height      Head Circumference      Peak Flow      Pain Score 6     Pain Loc      Pain Edu?      Excl. in GC?    No data found.  Updated Vital Signs BP 119/71   Pulse 88   Temp 98.1 F (36.7 C)   Resp 14   SpO2 98%   Visual Acuity Right Eye Distance:   Left Eye Distance:   Bilateral Distance:  Right Eye Near:   Left Eye Near:    Bilateral Near:     Physical Exam Vitals and nursing note reviewed.  Constitutional:      General: He is not in acute distress.    Appearance: He is well-developed. He is not ill-appearing.  HENT:     Head: Normocephalic and atraumatic.     Right Ear: Tympanic membrane normal.     Left Ear: Tympanic membrane normal.     Nose: Nose normal.     Mouth/Throat:     Mouth: Mucous membranes are moist.     Pharynx: Uvula midline. Posterior oropharyngeal erythema present. No uvula swelling.     Tonsils: Tonsillar exudate present. 2+ on the right. 1+ on the left.  Eyes:     Conjunctiva/sclera: Conjunctivae normal.  Cardiovascular:     Rate and Rhythm: Normal rate and regular rhythm.     Heart sounds: No murmur heard.   Pulmonary:     Effort: Pulmonary effort is normal. No respiratory distress.     Breath sounds: Normal breath sounds.  Abdominal:     Palpations: Abdomen is soft.     Tenderness: There is no abdominal tenderness. There is no guarding or rebound.  Musculoskeletal:      Cervical back: Neck supple.  Skin:    General: Skin is warm and dry.     Findings: No rash.  Neurological:     General: No focal deficit present.     Mental Status: He is alert and oriented to person, place, and time.     Gait: Gait normal.  Psychiatric:        Mood and Affect: Mood normal.        Behavior: Behavior normal.      UC Treatments / Results  Labs (all labs ordered are listed, but only abnormal results are displayed) Labs Reviewed  POCT RAPID STREP A (OFFICE) - Abnormal; Notable for the following components:      Result Value   Rapid Strep A Screen Positive (*)    All other components within normal limits    EKG   Radiology No results found.  Procedures Procedures (including critical care time)  Medications Ordered in UC Medications  penicillin g benzathine (BICILLIN LA) 1200000 UNIT/2ML injection 1.2 Million Units (has no administration in time range)    Initial Impression / Assessment and Plan / UC Course  I have reviewed the triage vital signs and the nursing notes.  Pertinent labs & imaging results that were available during my care of the patient were reviewed by me and considered in my medical decision making (see chart for details).   Strep throat.  Treated here with Bicillin LA.  Instructed patient to take Tylenol or ibuprofen as needed for fever or discomfort.  Discussed that he should follow-up with his PCP if his symptoms or not improving.  Patient declines COVID testing today.  Patient agrees to plan of care.   Final Clinical Impressions(s) / UC Diagnoses   Final diagnoses:  Streptococcal sore throat     Discharge Instructions     You were given an injection of long-acting penicillin here.  You do not need additional antibiotics at this time.    Take Tylenol or ibuprofen as needed for fever or discomfort.    Follow up with your primary care provider if your symptoms are not improving.       ED Prescriptions    None     PDMP  not reviewed this  encounter.   Mickie Bail, NP 01/02/20 1128

## 2020-01-02 NOTE — Discharge Instructions (Signed)
You were given an injection of long-acting penicillin here.  You do not need additional antibiotics at this time.    Take Tylenol or ibuprofen as needed for fever or discomfort.    Follow up with your primary care provider if your symptoms are not improving.

## 2020-01-07 ENCOUNTER — Ambulatory Visit: Payer: PRIVATE HEALTH INSURANCE | Admitting: Cardiovascular Disease

## 2020-01-09 ENCOUNTER — Ambulatory Visit
Admission: EM | Admit: 2020-01-09 | Discharge: 2020-01-09 | Disposition: A | Discharge status: Home / Self Care | Payer: Managed Care, Other (non HMO) | Attending: Physician Assistant | Admitting: Physician Assistant

## 2020-01-09 ENCOUNTER — Telehealth: Payer: Self-pay

## 2020-01-09 ENCOUNTER — Other Ambulatory Visit: Payer: Self-pay

## 2020-01-09 DIAGNOSIS — J029 Acute pharyngitis, unspecified: Secondary | ICD-10-CM | POA: Insufficient documentation

## 2020-01-09 DIAGNOSIS — R6883 Chills (without fever): Secondary | ICD-10-CM | POA: Insufficient documentation

## 2020-01-09 LAB — POCT RAPID STREP A (OFFICE): Rapid Strep A Screen: NEGATIVE

## 2020-01-09 MED ORDER — LIDOCAINE VISCOUS HCL 2 % MT SOLN: OROMUCOSAL | 0 refills | Status: DC

## 2020-01-09 NOTE — ED Notes (Signed)
Pt states will get a rapid covid test at school so he doesn't miss any practices.

## 2020-01-09 NOTE — Discharge Instructions (Signed)
Rapid strep negative. Symptoms are most likely due to viral illness/ drainage down your throat. COVID testing ordered, please quarantine until testing results return. Start lidocaine for sore throat, do not eat or drink for the next 40 mins after use as it can stunt your gag reflex. Over the counter flonase for nasal congestion/drainage if develop any. You can use over the counter nasal saline rinse such as neti pot for nasal congestion. Monitor for any worsening of symptoms, swelling of the throat, trouble breathing, trouble swallowing, leaning forward to breath, drooling, go to the emergency department for further evaluation needed.

## 2020-01-09 NOTE — ED Triage Notes (Signed)
Pt states dx'd with strep throat on 8/05 and given PCN shot. States sx's went away for few days and then returned. Pt c/o sore throat and chills.

## 2020-01-09 NOTE — ED Provider Notes (Signed)
[facility]    CSN: [id number] Arrival date & time: 01/09/20  1153      History   Chief Complaint Chief Complaint  Patient presents with   Sore Throat    HPI Asier D Tinnon is a 24 y.o. male.   24 year old male comes in for 2 day history of sore throat, chills.  He was seen 01/02/2020 for sore throat, it was tested positive for strep.  At the time, was given a Bicillin injection.  States symptoms resolved prior to current symptom onset.  Denies known fever.  Denies abdominal pain, nausea, vomiting.  Did have loose stools this morning.  Denies shortness of breath, loss of taste or smell.  Denies urinary, nasal congestion, cough.  Fully COVID vaccinated.  No known exposures.     Past Medical History:  Diagnosis Date   Pubertal delay     Patient Active Problem List   Diagnosis Date Noted   Acute upper respiratory infection 06/27/2016    History reviewed. No pertinent surgical history.     Home Medications    Prior to Admission medications   Medication Sig Start Date End Date Taking? Authorizing Provider  lidocaine (XYLOCAINE) 2 % solution 5-15 mL gurgle as needed 01/09/20   Belinda Fisher, PA-C    Family History Family History  Problem Relation Age of Onset   Cancer Paternal Grandfather        lung   Delayed puberty Mother    Hypertension Mother    Delayed puberty Father    Early puberty Brother    Cancer Maternal Grandmother        skin   Hypertension Maternal Grandfather    Heart attack Maternal Grandfather    Thyroid disease Paternal Grandmother        graves dx age 13    Social History Social History   Tobacco Use   Smoking status: Never Smoker   Smokeless tobacco: Never Used  Substance Use Topics   Alcohol use: Yes    Comment: weekly   Drug use: No     Allergies   Patient has no known allergies.   Review of Systems Review of Systems  Reason unable to perform ROS: See HPI as above.     Physical Exam Triage  Vital Signs ED Triage Vitals  Enc Vitals Group     BP 01/09/20 1224 132/75     Pulse Rate 01/09/20 1224 64     Resp 01/09/20 1224 18     Temp 01/09/20 1224 98.2 F (36.8 C)     Temp Source 01/09/20 1224 Oral     SpO2 01/09/20 1224 97 %     Weight --      Height --      Head Circumference --      Peak Flow --      Pain Score 01/09/20 1225 5     Pain Loc --      Pain Edu? --      Excl. in GC? --    No data found.  Updated Vital Signs BP 132/75 (BP Location: Left Arm)    Pulse 64    Temp 98.2 F (36.8 C) (Oral)    Resp 18    SpO2 97%   Physical Exam Constitutional:      General: He is not in acute distress.    Appearance: Normal appearance. He is not ill-appearing, toxic-appearing or diaphoretic.  HENT:     Head: Normocephalic and atraumatic.  Mouth/Throat:     Mouth: Mucous membranes are moist.     Pharynx: Oropharynx is clear. Uvula midline.     Tonsils: No tonsillar exudate. 1+ on the right. 2+ on the left.  Cardiovascular:     Rate and Rhythm: Normal rate and regular rhythm.     Heart sounds: Normal heart sounds. No murmur heard.  No friction rub. No gallop.   Pulmonary:     Effort: Pulmonary effort is normal. No accessory muscle usage, prolonged expiration, respiratory distress or retractions.     Comments: Lungs clear to auscultation without adventitious lung sounds. Musculoskeletal:     Cervical back: Normal range of motion and neck supple.  Neurological:     General: No focal deficit present.     Mental Status: He is alert and oriented to person, place, and time.      UC Treatments / Results  Labs (all labs ordered are listed, but only abnormal results are displayed) Labs Reviewed  POCT RAPID STREP A (OFFICE) - Normal  CULTURE, GROUP A STREP [facility])    EKG   Radiology No results found.  Procedures Procedures (including critical care time)  Medications Ordered in UC Medications - No data to display  Initial Impression / Assessment and Plan  / UC Course  I have reviewed the triage vital signs and the nursing notes.  Pertinent labs & imaging results that were available during my care of the patient were reviewed by me and considered in my medical decision making (see chart for details).    Rapid strep negative.  Patient will get Covid testing at school as they have rapid Covid testing available.  Will provide symptomatic management.  Return precautions given.  Final Clinical Impressions(s) / UC Diagnoses   Final diagnoses:  Sore throat  Chills   ED Prescriptions    Medication Sig Dispense Auth. Provider   lidocaine (XYLOCAINE) 2 % solution 5-15 mL gurgle as needed 150 mL Belinda Fisher, PA-C     PDMP not reviewed this encounter.   Belinda Fisher, PA-C 01/09/20 1554

## 2020-01-11 LAB — CULTURE, GROUP A STREP (THRC)

## 2020-03-25 ENCOUNTER — Other Ambulatory Visit: Payer: Self-pay

## 2020-03-25 ENCOUNTER — Emergency Department (HOSPITAL_COMMUNITY)
Admission: EM | Admit: 2020-03-25 | Discharge: 2020-03-25 | Disposition: A | Discharge status: Home / Self Care | Payer: Managed Care, Other (non HMO) | Attending: Emergency Medicine | Admitting: Emergency Medicine

## 2020-03-25 ENCOUNTER — Encounter (HOSPITAL_COMMUNITY): Payer: Self-pay

## 2020-03-25 ENCOUNTER — Emergency Department (HOSPITAL_COMMUNITY): Payer: Managed Care, Other (non HMO)

## 2020-03-25 DIAGNOSIS — M545 Low back pain, unspecified: Secondary | ICD-10-CM | POA: Diagnosis present

## 2020-03-25 DIAGNOSIS — Z20822 Contact with and (suspected) exposure to covid-19: Secondary | ICD-10-CM | POA: Insufficient documentation

## 2020-03-25 DIAGNOSIS — N200 Calculus of kidney: Secondary | ICD-10-CM

## 2020-03-25 DIAGNOSIS — N201 Calculus of ureter: Secondary | ICD-10-CM | POA: Insufficient documentation

## 2020-03-25 LAB — CBC WITH DIFFERENTIAL/PLATELET
Abs Immature Granulocytes: 0.04 10*3/uL (ref 0.00–0.07)
Basophils Absolute: 0 10*3/uL (ref 0.0–0.1)
Basophils Relative: 0 %
Eosinophils Absolute: 0.2 10*3/uL (ref 0.0–0.5)
Eosinophils Relative: 1 %
HCT: 40.3 % (ref 39.0–52.0)
Hemoglobin: 13.6 g/dL (ref 13.0–17.0)
Immature Granulocytes: 0 %
Lymphocytes Relative: 20 %
Lymphs Abs: 3 10*3/uL (ref 0.7–4.0)
MCH: 31.4 pg (ref 26.0–34.0)
MCHC: 33.7 g/dL (ref 30.0–36.0)
MCV: 93.1 fL (ref 80.0–100.0)
Monocytes Absolute: 0.7 10*3/uL (ref 0.1–1.0)
Monocytes Relative: 4 %
Neutro Abs: 11.2 10*3/uL — ABNORMAL HIGH (ref 1.7–7.7)
Neutrophils Relative %: 75 %
Platelets: 232 10*3/uL (ref 150–400)
RBC: 4.33 MIL/uL (ref 4.22–5.81)
RDW: 12.6 % (ref 11.5–15.5)
WBC: 15.1 10*3/uL — ABNORMAL HIGH (ref 4.0–10.5)
nRBC: 0 % (ref 0.0–0.2)

## 2020-03-25 LAB — URINALYSIS, ROUTINE W REFLEX MICROSCOPIC
Bacteria, UA: NONE SEEN
Bilirubin Urine: NEGATIVE
Glucose, UA: NEGATIVE mg/dL
Ketones, ur: NEGATIVE mg/dL
Leukocytes,Ua: NEGATIVE
Nitrite: NEGATIVE
Protein, ur: 30 mg/dL — AB
RBC / HPF: >50 RBC/hpf — ABNORMAL HIGH (ref 0–5)
Specific Gravity, Urine: 1.02 (ref 1.005–1.030)
pH: 5 (ref 5.0–8.0)

## 2020-03-25 LAB — COMPREHENSIVE METABOLIC PANEL
ALT: 13 U/L (ref 0–44)
AST: 14 U/L — ABNORMAL LOW (ref 15–41)
Albumin: 4.6 g/dL (ref 3.5–5.0)
Alkaline Phosphatase: 49 U/L (ref 38–126)
Anion gap: 10 (ref 5–15)
BUN: 15 mg/dL (ref 6–20)
CO2: 25 mmol/L (ref 22–32)
Calcium: 9.2 mg/dL (ref 8.9–10.3)
Chloride: 105 mmol/L (ref 98–111)
Creatinine, Ser: 1.08 mg/dL (ref 0.61–1.24)
GFR, Estimated: >60 mL/min (ref >60)
Glucose, Bld: 139 mg/dL — ABNORMAL HIGH (ref 70–99)
Potassium: 3.4 mmol/L — ABNORMAL LOW (ref 3.5–5.1)
Sodium: 140 mmol/L (ref 135–145)
Total Bilirubin: 0.6 mg/dL (ref 0.3–1.2)
Total Protein: 7.2 g/dL (ref 6.5–8.1)

## 2020-03-25 LAB — RESPIRATORY PANEL BY RT PCR (FLU A&B, COVID)
Influenza A by PCR: NEGATIVE
Influenza B by PCR: NEGATIVE
SARS Coronavirus 2 by RT PCR: NEGATIVE

## 2020-03-25 LAB — LIPASE, BLOOD: Lipase: 28 U/L (ref 11–51)

## 2020-03-25 MED ORDER — MORPHINE SULFATE (PF) 4 MG/ML IV SOLN
4.0000 mg | Freq: Once | INTRAVENOUS | Status: AC
  Administered 2020-03-25: 4 mg via INTRAVENOUS
  Filled 2020-03-25: qty 1

## 2020-03-25 MED ORDER — ONDANSETRON 4 MG PO TBDP: 4.0000 mg | ORAL_TABLET | Freq: Three times a day (TID) | ORAL | 0 refills | Status: AC | PRN

## 2020-03-25 MED ORDER — ONDANSETRON HCL 4 MG/2ML IJ SOLN
4.0000 mg | Freq: Once | INTRAMUSCULAR | Status: AC
  Administered 2020-03-25: 4 mg via INTRAVENOUS
  Filled 2020-03-25: qty 2

## 2020-03-25 MED ORDER — KETOROLAC TROMETHAMINE 30 MG/ML IJ SOLN
30.0000 mg | Freq: Once | INTRAMUSCULAR | Status: AC
  Administered 2020-03-25: 30 mg via INTRAVENOUS
  Filled 2020-03-25: qty 1

## 2020-03-25 MED ORDER — OXYCODONE HCL 5 MG PO TABS
5.0000 mg | ORAL_TABLET | Freq: Four times a day (QID) | ORAL | 0 refills | Status: AC | PRN
Start: 2020-03-25 — End: ?

## 2020-03-25 MED ORDER — METOCLOPRAMIDE HCL 5 MG/ML IJ SOLN
10.0000 mg | Freq: Once | INTRAMUSCULAR | Status: AC
  Administered 2020-03-25: 10 mg via INTRAVENOUS
  Filled 2020-03-25: qty 2

## 2020-03-25 MED ORDER — TAMSULOSIN HCL 0.4 MG PO CAPS: 0.4000 mg | ORAL_CAPSULE | Freq: Every day | ORAL | 0 refills | Status: AC

## 2020-03-25 MED ORDER — HYDROMORPHONE HCL 1 MG/ML IJ SOLN
0.5000 mg | Freq: Once | INTRAMUSCULAR | Status: AC
  Administered 2020-03-25: 0.5 mg via INTRAVENOUS
  Filled 2020-03-25: qty 1

## 2020-03-25 NOTE — ED Notes (Signed)
Pt c/o increased right sided pain with vomiting. Provider made aware. Med a/o for pain and nausea. Mother at bedside. Pt is A/Ox3. Skin w/d/pink. Resp wnl, equal and non-labored. NAD. Cont to await CT. Cont to monitor.

## 2020-03-25 NOTE — ED Provider Notes (Signed)
[facility]-EMERGENCY DEPT Provider Note   CSN: [id number] Arrival date & time: 03/25/20  0100     History No chief complaint on file.   Patrick Davidson is a 24 y.o. male with no significant past medical history who presents the emergency department with a chief complaint of right low back pain.  The patient endorses sharp, 10 out of 10, right-sided low back pain that is nonradiating that began earlier tonight.  He has had a difficult time finding a comfortable position due to the pain.  No history of similar pain.  He reports associated nausea and one episode of bright green vomiting prior to arrival.  He reports that he has had 2 bowel movements since the onset of pain, which have been normal.  He denies fever, chills, dysuria, hematuria, urinary hesitancy or frequency, penile or testicular pain or swelling, chest pain, shortness of breath, cough.  He does report that he was hit in the head with a soccer ball at practice this afternoon.  He did not lose consciousness.  He has been acting appropriately, per his mother.  No confusion.  He did not have any nausea or vomiting for at least 6 hours after the incident.  He denies having a headache.  No history of abdominal surgery.  No history of kidney stones.  The patient's mother was recently diagnosed with kidney stones.  The history is provided by the patient and medical records. No language interpreter was used.       Past Medical History:  Diagnosis Date   Pubertal delay     Patient Active Problem List   Diagnosis Date Noted   Acute upper respiratory infection 06/27/2016    History reviewed. No pertinent surgical history.     Family History  Problem Relation Age of Onset   Cancer Paternal Grandfather        lung   Delayed puberty Mother    Hypertension Mother    Delayed puberty Father    Early puberty Brother    Cancer Maternal Grandmother        skin   Hypertension Maternal  Grandfather    Heart attack Maternal Grandfather    Thyroid disease Paternal Grandmother        graves dx age 43    Social History   Tobacco Use   Smoking status: Never Smoker   Smokeless tobacco: Never Used  Substance Use Topics   Alcohol use: Yes    Comment: weekly   Drug use: No    Home Medications Prior to Admission medications   Medication Sig Start Date End Date Taking? Authorizing Provider  ondansetron (ZOFRAN ODT) 4 MG disintegrating tablet Take 1 tablet (4 mg total) by mouth every 8 (eight) hours as needed. 03/25/20   Natayla Cadenhead A, PA-C  oxyCODONE (ROXICODONE) 5 MG immediate release tablet Take 1 tablet (5 mg total) by mouth every 6 (six) hours as needed for severe pain. 03/25/20   Hurley Blevins A, PA-C  tamsulosin (FLOMAX) 0.4 MG CAPS capsule Take 1 capsule (0.4 mg total) by mouth daily. 03/25/20   Annalisse Minkoff A, PA-C    Allergies    Patient has no known allergies.  Review of Systems   Review of Systems  Constitutional: Negative for appetite change, chills and fever.  HENT: Negative for congestion, sinus pressure and sore throat.   Eyes: Negative for visual disturbance.  Respiratory: Negative for shortness of breath.   Cardiovascular: Negative for chest pain and palpitations.  Gastrointestinal:  Positive for abdominal pain, nausea and vomiting. Negative for blood in stool and constipation.  Genitourinary: Positive for flank pain. Negative for dysuria, penile pain, penile swelling, scrotal swelling and urgency.  Musculoskeletal: Positive for back pain. Negative for arthralgias, myalgias, neck pain and neck stiffness.  Skin: Negative for rash.  Allergic/Immunologic: Negative for immunocompromised state.  Neurological: Negative for seizures, syncope, weakness, numbness and headaches.  Psychiatric/Behavioral: Negative for confusion.    Physical Exam Updated Vital Signs BP (!) 128/92    Pulse (!) 50    Temp 97.6 F (36.4 C) (Oral)    Resp 16    SpO2 100%     Physical Exam Vitals and nursing note reviewed.  Constitutional:      Appearance: He is well-developed. He is not ill-appearing or diaphoretic.     Comments: Uncomfortable appearing  HENT:     Head: Normocephalic.  Eyes:     Conjunctiva/sclera: Conjunctivae normal.  Cardiovascular:     Rate and Rhythm: Normal rate and regular rhythm.     Pulses: Normal pulses.     Heart sounds: Normal heart sounds. No murmur heard.  No friction rub. No gallop.   Pulmonary:     Effort: Pulmonary effort is normal. No respiratory distress.     Breath sounds: No stridor. No wheezing, rhonchi or rales.  Chest:     Chest wall: No tenderness.  Abdominal:     General: There is no distension.     Palpations: Abdomen is soft.     Tenderness: There is no right CVA tenderness or left CVA tenderness.     Comments: Abdomen is soft, nondistended.  No focal tenderness noted to the abdomen.  Negative Murphy sign.  No tenderness over McBurney's point.  No CVA tenderness bilaterally.  Negative Rovsing sign.  Normoactive bowel sounds in all 4 quadrants.  Musculoskeletal:        General: No tenderness.       Arms:     Cervical back: Neck supple.     Right lower leg: No edema.     Left lower leg: No edema.     Comments: Tender to palpation in the musculature of the right lumbar region.  No rashes.  No erythema, induration, or fluctuance.  Pelvis is unremarkable.  Skin:    General: Skin is warm and dry.  Neurological:     Mental Status: He is alert.  Psychiatric:        Behavior: Behavior normal.     ED Results / Procedures / Treatments   Labs (all labs ordered are listed, but only abnormal results are displayed) Labs Reviewed  URINALYSIS, ROUTINE W REFLEX MICROSCOPIC - Abnormal; Notable for the following components:      Result Value   APPearance HAZY (*)    Hgb urine dipstick LARGE (*)    Protein, ur 30 (*)    RBC / HPF >50 (*)    All other components within normal limits  CBC WITH  DIFFERENTIAL/PLATELET - Abnormal; Notable for the following components:   WBC 15.1 (*)    Neutro Abs 11.2 (*)    All other components within normal limits  COMPREHENSIVE METABOLIC PANEL - Abnormal; Notable for the following components:   Potassium 3.4 (*)    Glucose, Bld 139 (*)    AST 14 (*)    All other components within normal limits  RESPIRATORY PANEL BY RT PCR (FLU A&B, COVID)  LIPASE, BLOOD    EKG None  Radiology CT Renal Stone Study  Result Date: 03/25/2020 CLINICAL DATA:  Right flank pain with unknown cause. EXAM: CT ABDOMEN AND PELVIS WITHOUT CONTRAST TECHNIQUE: Multidetector CT imaging of the abdomen and pelvis was performed following the standard protocol without IV contrast. COMPARISON:  None. FINDINGS: Lower chest:  No contributory findings. Hepatobiliary: No focal liver abnormality.No evidence of biliary obstruction or stone. Pancreas: Unremarkable. Spleen: Unremarkable. Adrenals/Urinary Tract: Negative adrenals. Right hydroureteronephrosis from a 3 mm UVJ calculus. At least 2 punctate right lower pole calculus. Multiple bilateral renal medullae are high-density without discrete calculi. Unremarkable bladder. Stomach/Bowel:  No obstruction. No appendicitis. Vascular/Lymphatic: No acute vascular abnormality. No mass or adenopathy. Reproductive:No pathologic findings. Other: No ascites or pneumoperitoneum. Musculoskeletal: No acute abnormalities. IMPRESSION: 1. Obstructing 3 mm right UVJ calculus. 2. Small right renal calculi. Electronically Signed   By: Marnee Spring M.D.   On: 03/25/2020 04:09    Procedures Procedures (including critical care time)  Medications Ordered in ED Medications  ondansetron (ZOFRAN) injection 4 mg (4 mg Intravenous Given 03/25/20 0141)  morphine 4 MG/ML injection 4 mg (4 mg Intravenous Given 03/25/20 0226)  HYDROmorphone (DILAUDID) injection 0.5 mg (0.5 mg Intravenous Given 03/25/20 0337)  metoCLOPramide (REGLAN) injection 10 mg (10 mg  Intravenous Given 03/25/20 0337)  ketorolac (TORADOL) 30 MG/ML injection 30 mg (30 mg Intravenous Given 03/25/20 0449)    ED Course  I have reviewed the triage vital signs and the nursing notes.  Pertinent labs & imaging results that were available during my care of the patient were reviewed by me and considered in my medical decision making (see chart for details).  Clinical Course as of Mar 25 540  Wed Mar 25, 2020  0130 Patient is actively vomiting at bedside.   [MM]  [phone] Patient sleeping on entry to the room.  He arouses easily to voice.  He reports that he is pain-free at this time.  He is ready for fluid challenge.   [MM]    Clinical Course User Index [MM] Jaleigh Mccroskey, Coral Else, PA-C   MDM Rules/Calculators/A&P                          24 year old male with no significant past medical history presenting with right-sided low back pain, nausea, vomiting, onset tonight.  Patient was hit in the head with a soccer ball at practice earlier today, but I have a low suspicion that the vomiting is secondary to postconcussive syndrome as this incident occurred more than 12 hours ago.   This patient presents to the ED for concern of ureterolithiasis this involves an extensive number of treatment options, and is a complaint that carries with it a high risk of complications and morbidity.  The differential diagnosis includes urosepsis, appendicitis, UTI, or postconcussive syndrome, or testicular torsion.      Lab Tests:    I ordered, reviewed, and interpreted labs, which included  CBC, metabolic panel, urinalysis that showed a leukocytosis and hemoglobinuria that suggest ureterolithiasis.  Leukocytosis with right lower back pain could be concerning for an atypical presentation of appendicitis, but given that patient is writhing around on the bed and is having a difficult time getting comfortable with hemoglobinuria, presentation is more consistent with renal calculus.  I suspect leukocytosis may  be reactive.   Medicines ordered:    I ordered Dilaudid, morphine, and Toradol for pain control and Reglan and Zofran for nausea and vomiting.   Imaging Studies ordered:    I ordered imaging studies which included  CT stone study  I independently visualized and interpreted imaging which showed  a 3 mm obstructive calculus at the right UVJ.  Several other small right renal calculi are also noted.   Additional history obtained:    Previous records obtained and reviewed   Reevaluation:   After the interventions stated above, I reevaluated the patient and found improved.  Findings were discussed with the patient and his mother bedside.  All questions answered.  Pain had completely resolved after he was given Toradol.  He was feeling much improved and was ready for discharge.  He was successfully fluid challenge.  He will be discharged with pain medication, Flomax, Zofran, and a referral to urology.  ER return precautions given.  He is hemodynamically stable to no acute distress.  Safe for discharge to home with outpatient follow-up as indicated.  Final Clinical Impression(s) / ED Diagnoses Final diagnoses:  Ureterolithiasis  Right nephrolithiasis    Rx / DC Orders ED Discharge Orders         Ordered    tamsulosin (FLOMAX) 0.4 MG CAPS capsule  Daily        03/25/20 0523    oxyCODONE (ROXICODONE) 5 MG immediate release tablet  Every 6 hours PRN        03/25/20 0523    ondansetron (ZOFRAN ODT) 4 MG disintegrating tablet  Every 8 hours PRN        03/25/20 0523           Frederik PearMcDonald, Ethridge Sollenberger A, PA-C 03/25/20 0541    Mesner, Barbara CowerJason, MD 03/25/20 2351

## 2020-03-25 NOTE — ED Triage Notes (Signed)
Patient arrived with complaints of right sided flank/ abdominal pain, nausea and vomiting that started tonight.

## 2020-03-25 NOTE — ED Notes (Addendum)
Tolerated fluid challenge without difficulty. No n/v/d/abd pain noted. Re-eval by provider. Urine strainer given and demonstrated on use.

## 2020-03-25 NOTE — ED Notes (Signed)
Med a/o. Pt sipping ginger ale.

## 2020-03-25 NOTE — Discharge Instructions (Addendum)
Thank you for allowing me to care for you today in the Emergency Department.   You were seen today for right low back pain, nausea, vomiting.  You were found to have a 3 mm kidney stone on the right side.   Take 650 mg of Tylenol or 600 mg of ibuprofen with food every 6 hours for pain.  You can alternate between these 2 medications every 3 hours if your pain returns.  For instance, you can take Tylenol at noon, followed by a dose of ibuprofen at 3, followed by second dose of Tylenol and 6.  If your pain is well controlled with this, you do not need to take oxycodone.  However, if you continue to have an controllable pain, you can take 1 tablet of oxycodone once every 6 hours.  This medicine is a narcotic.  It can be addicting with continued use.  You should not work or drive while taking this medication.  You should not drink alcohol or take other sedating substances while taking this medication.  You should discontinue this medication as soon as your pain is well controlled with other medications.  Let 1 tablet of Zofran dissolve in your tongue every 8 hours as needed.  Take 1 tab of Flomax daily until you pass the kidney stone.  You can strain your urine to catch the stone after you pass it.  Sometimes they will send this off for analysis to determine what kind of stone it is.  Your scan did also show that you do have a couple of smaller stones in the right kidney.  Please note that these will probably pass at some time, but it is hard to know when.  Return to the emergency department if you develop uncontrollable vomiting despite taking Zofran, if you continue to have severe, uncontrollable pain despite following a pain regimen above, if you stop producing urine, or if you develop other new, concerning symptoms.

## 2021-08-06 IMAGING — CT CT RENAL STONE PROTOCOL
2 of 4 series · 17 of 46 positions shown, 19 images · non-contrast
Comparison: None.

CLINICAL DATA: Right flank pain with unknown cause.

EXAM:
CT ABDOMEN AND PELVIS WITHOUT CONTRAST
TECHNIQUE: Multidetector CT imaging of the abdomen and pelvis was performed
following the standard protocol without IV contrast.

[Series 2: axial st · axial · 0.69mm/px · z∈[-297,+93]mm · 14 of 88 slices shown, 16 images]
[im 5/88  soft-tissue]
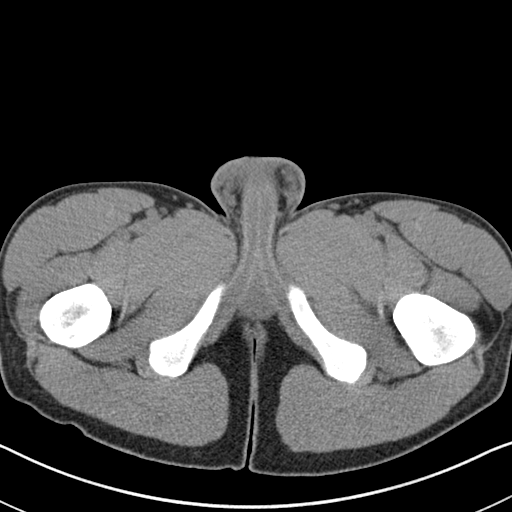
[im 5/88  bone]
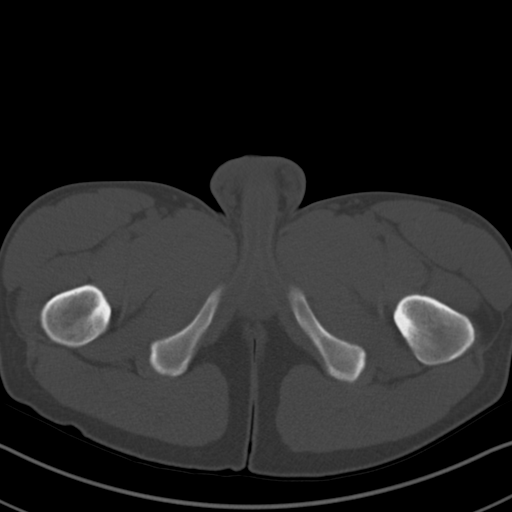
[im 10/88  soft-tissue]
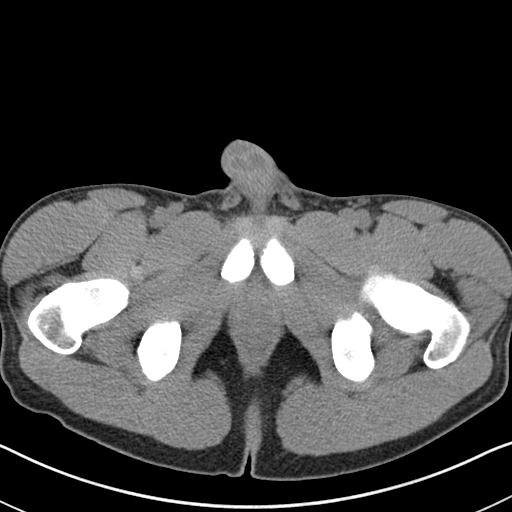
[im 19/88  soft-tissue]
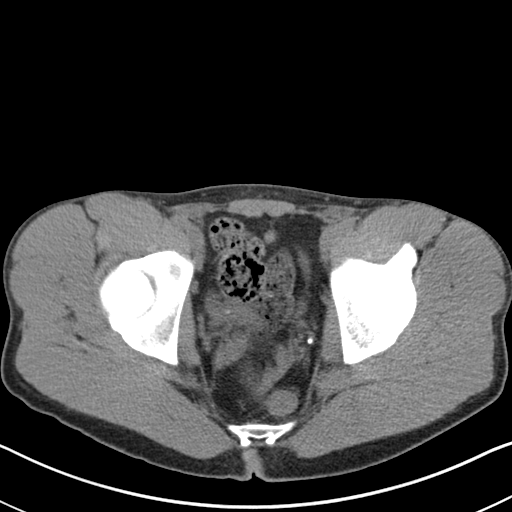
[im 23/88  soft-tissue]
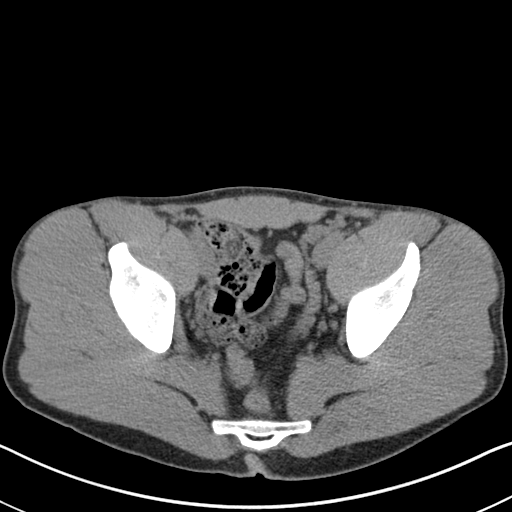
[im 28/88  soft-tissue]
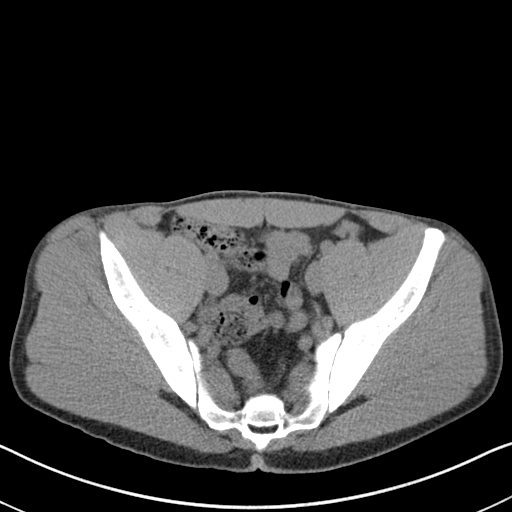
[im 37/88  soft-tissue]
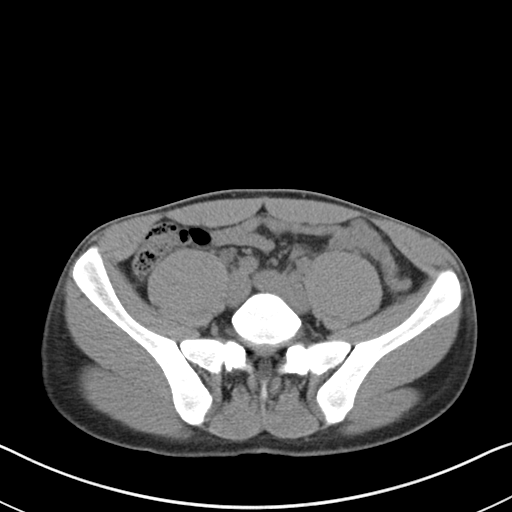
[im 42/88  soft-tissue]
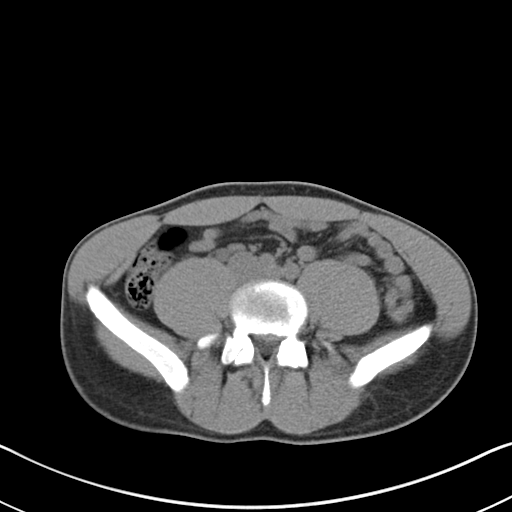
[im 46/88  soft-tissue]
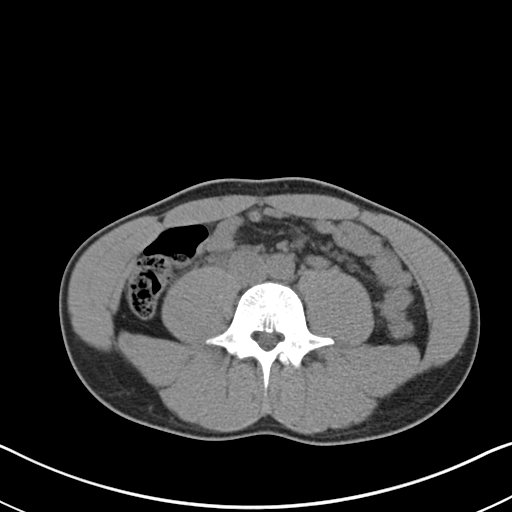
[im 51/88  soft-tissue]
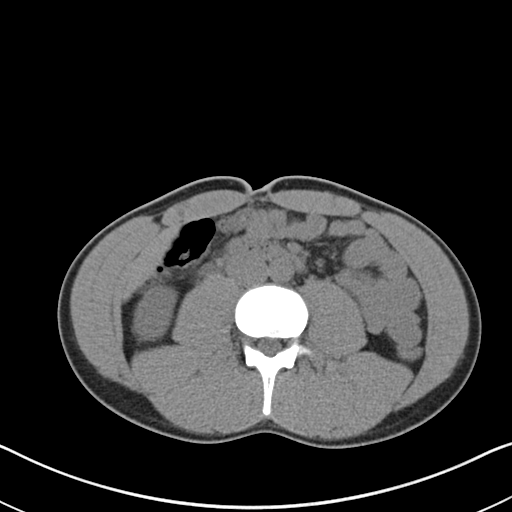
[im 51/88  bone]
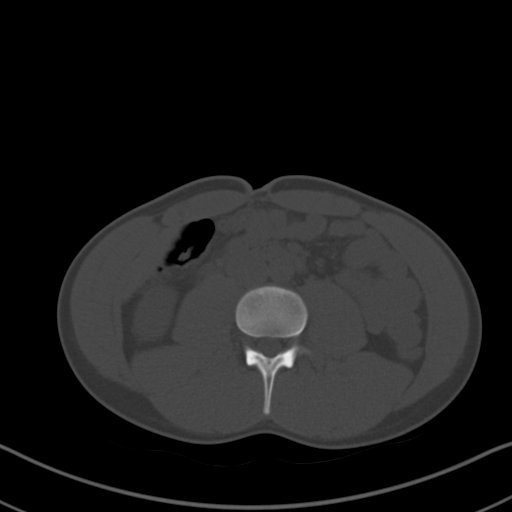
[im 60/88  soft-tissue]
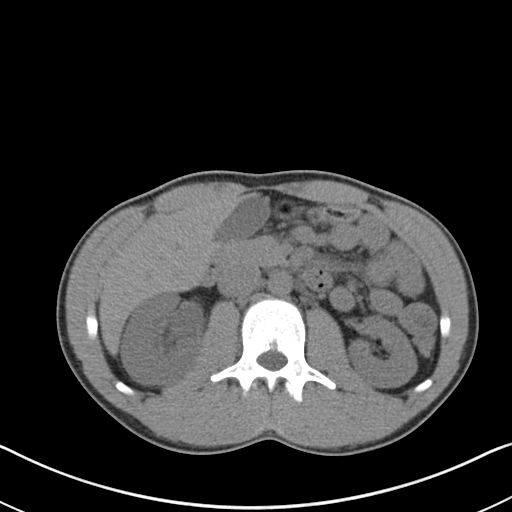
[im 65/88  soft-tissue]
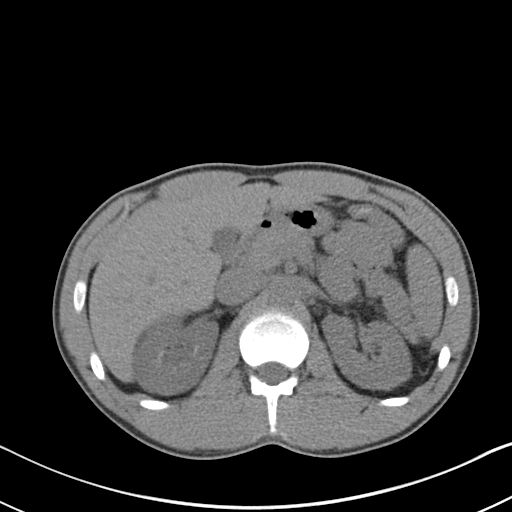
[im 69/88  soft-tissue]
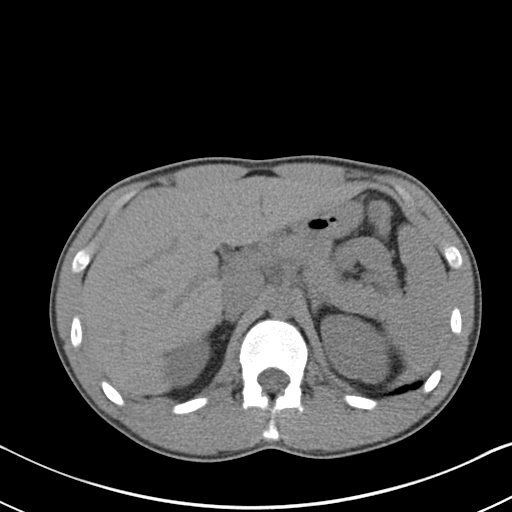
[im 78/88  soft-tissue]
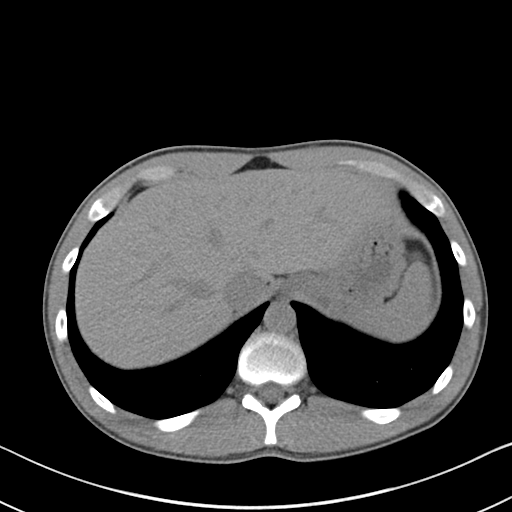
[im 83/88  soft-tissue]
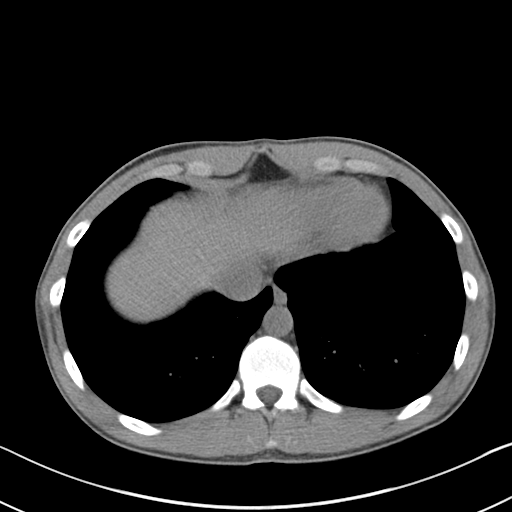

[Series 4: coronal · coronal · 0.71mm/px · 3 of 116 slices shown]
[im 39/116  soft-tissue]
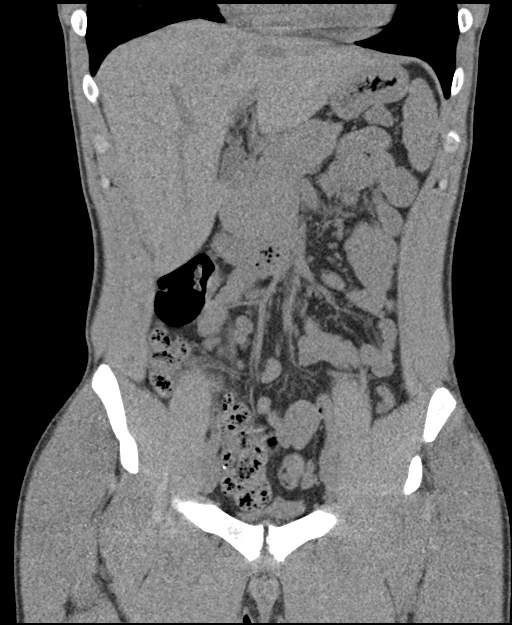
[im 52/116  soft-tissue]
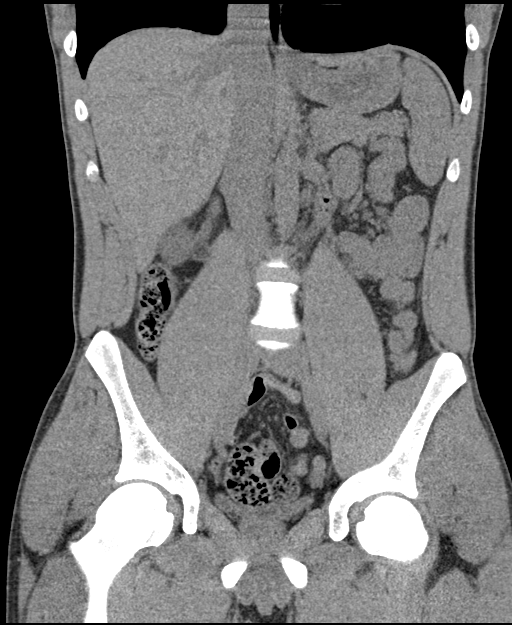
[im 64/116  soft-tissue]
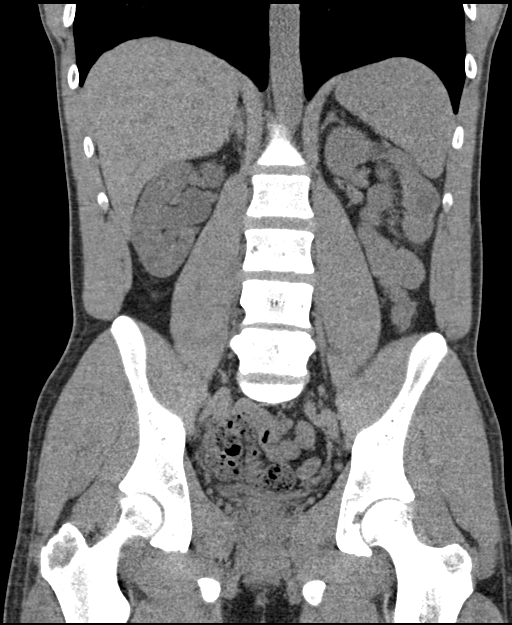

[17 of 46 positions shown; findings below may reference images not displayed]

FINDINGS: Lower chest:  No contributory findings.

Hepatobiliary: No focal liver abnormality.No evidence of biliary
obstruction or stone.

Pancreas: Unremarkable.

Spleen: Unremarkable.

Adrenals/Urinary Tract: Negative adrenals. Right
hydroureteronephrosis from a 3 mm UVJ calculus. At least 2 punctate
right lower pole calculus. Multiple bilateral renal medullae are
high-density without discrete calculi. Unremarkable bladder.

Stomach/Bowel:  No obstruction. No appendicitis.

Vascular/Lymphatic: No acute vascular abnormality. No mass or
adenopathy.

Reproductive:No pathologic findings.

Other: No ascites or pneumoperitoneum.

Musculoskeletal: No acute abnormalities.
IMPRESSION: 1. Obstructing 3 mm right UVJ calculus.
2. Small right renal calculi.
